# Patient Record
Sex: Male | Born: 1975 | Race: White | Hispanic: No | Marital: Married | Smoking: Heavy tobacco smoker
Health system: Southern US, Community
[De-identification: ages and names within clinical notes are randomized; demographics above are authoritative.]

## PROBLEM LIST (undated history)

## (undated) DIAGNOSIS — I10 Essential (primary) hypertension: Secondary | ICD-10-CM

## (undated) DIAGNOSIS — E119 Type 2 diabetes mellitus without complications: Secondary | ICD-10-CM

## (undated) DIAGNOSIS — M623 Immobility syndrome (paraplegic): Secondary | ICD-10-CM

## (undated) DIAGNOSIS — Z87440 Personal history of urinary (tract) infections: Secondary | ICD-10-CM

## (undated) HISTORY — PX: COCCYGECTOMY: SUR274

## (undated) HISTORY — PX: COLON SURGERY: SHX602

---

## 2015-01-05 ENCOUNTER — Inpatient Hospital Stay (HOSPITAL_COMMUNITY)
Admit: 2015-01-05 | Discharge: 2015-02-10 | Disposition: A | Discharge status: Home Health | Payer: Medicare PPO | Source: Other Acute Inpatient Hospital | Attending: Internal Medicine | Admitting: Internal Medicine

## 2015-01-05 DIAGNOSIS — L98429 Non-pressure chronic ulcer of back with unspecified severity: Secondary | ICD-10-CM | POA: Diagnosis present

## 2015-01-05 DIAGNOSIS — Z95828 Presence of other vascular implants and grafts: Secondary | ICD-10-CM

## 2015-01-05 DIAGNOSIS — R52 Pain, unspecified: Secondary | ICD-10-CM

## 2015-01-05 DIAGNOSIS — L0291 Cutaneous abscess, unspecified: Secondary | ICD-10-CM

## 2015-01-05 HISTORY — DX: Type 2 diabetes mellitus without complications: E11.9

## 2015-01-05 HISTORY — DX: Essential (primary) hypertension: I10

## 2015-01-05 HISTORY — DX: Personal history of urinary (tract) infections: Z87.440

## 2015-01-05 HISTORY — DX: Immobility syndrome (paraplegic): M62.3

## 2015-01-06 LAB — CBC WITH DIFFERENTIAL/PLATELET
Basophils Absolute: 0 10*3/uL (ref 0.0–0.1)
Basophils Relative: 0 %
EOS ABS: 0.1 10*3/uL (ref 0.0–0.7)
Eosinophils Relative: 2 %
HEMATOCRIT: 22.5 % — AB (ref 39.0–52.0)
HEMOGLOBIN: 7.2 g/dL — AB (ref 13.0–17.0)
LYMPHS ABS: 1.1 10*3/uL (ref 0.7–4.0)
Lymphocytes Relative: 17 %
MCH: 27.4 pg (ref 26.0–34.0)
MCHC: 32 g/dL (ref 30.0–36.0)
MCV: 85.6 fL (ref 78.0–100.0)
MONO ABS: 0.6 10*3/uL (ref 0.1–1.0)
MONOS PCT: 9 %
NEUTROS ABS: 4.6 10*3/uL (ref 1.7–7.7)
NEUTROS PCT: 72 %
Platelets: 253 10*3/uL (ref 150–400)
RBC: 2.63 MIL/uL — ABNORMAL LOW (ref 4.22–5.81)
RDW: 17.3 % — AB (ref 11.5–15.5)
WBC: 6.5 10*3/uL (ref 4.0–10.5)

## 2015-01-06 LAB — COMPREHENSIVE METABOLIC PANEL
ALK PHOS: 113 U/L (ref 38–126)
ALT: 11 U/L — ABNORMAL LOW (ref 17–63)
ANION GAP: 7 (ref 5–15)
AST: 23 U/L (ref 15–41)
Albumin: 1.9 g/dL — ABNORMAL LOW (ref 3.5–5.0)
BILIRUBIN TOTAL: 0.2 mg/dL — AB (ref 0.3–1.2)
BUN: 5 mg/dL — ABNORMAL LOW (ref 6–20)
CALCIUM: 8.2 mg/dL — AB (ref 8.9–10.3)
CO2: 23 mmol/L (ref 22–32)
Chloride: 103 mmol/L (ref 101–111)
Creatinine, Ser: 1.04 mg/dL (ref 0.61–1.24)
GFR calc non Af Amer: >60 mL/min (ref >60)
Glucose, Bld: 139 mg/dL — ABNORMAL HIGH (ref 65–99)
Potassium: 4.5 mmol/L (ref 3.5–5.1)
Sodium: 133 mmol/L — ABNORMAL LOW (ref 135–145)
TOTAL PROTEIN: 6.4 g/dL — AB (ref 6.5–8.1)

## 2015-01-06 LAB — VITAMIN B12: Vitamin B-12: 546 pg/mL (ref 180–914)

## 2015-01-06 LAB — FERRITIN: Ferritin: 252 ng/mL (ref 24–336)

## 2015-01-06 LAB — IRON AND TIBC
Iron: 14 ug/dL — ABNORMAL LOW (ref 45–182)
SATURATION RATIOS: 10 % — AB (ref 17.9–39.5)
TIBC: 137 ug/dL — ABNORMAL LOW (ref 250–450)
UIBC: 123 ug/dL

## 2015-01-06 LAB — PHOSPHORUS: PHOSPHORUS: 4.2 mg/dL (ref 2.5–4.6)

## 2015-01-06 LAB — MAGNESIUM: MAGNESIUM: 1.9 mg/dL (ref 1.7–2.4)

## 2015-01-07 LAB — HEMOGLOBIN AND HEMATOCRIT, BLOOD
HCT: 24.5 % — ABNORMAL LOW (ref 39.0–52.0)
Hemoglobin: 7.9 g/dL — ABNORMAL LOW (ref 13.0–17.0)

## 2015-01-08 LAB — CBC WITH DIFFERENTIAL/PLATELET
BASOS ABS: 0 10*3/uL (ref 0.0–0.1)
Basophils Relative: 0 %
EOS PCT: 2 %
Eosinophils Absolute: 0.1 10*3/uL (ref 0.0–0.7)
HEMATOCRIT: 24 % — AB (ref 39.0–52.0)
HEMOGLOBIN: 7.7 g/dL — AB (ref 13.0–17.0)
LYMPHS ABS: 1.3 10*3/uL (ref 0.7–4.0)
LYMPHS PCT: 19 %
MCH: 27.5 pg (ref 26.0–34.0)
MCHC: 32.1 g/dL (ref 30.0–36.0)
MCV: 85.7 fL (ref 78.0–100.0)
Monocytes Absolute: 0.7 10*3/uL (ref 0.1–1.0)
Monocytes Relative: 10 %
NEUTROS ABS: 4.9 10*3/uL (ref 1.7–7.7)
NEUTROS PCT: 69 %
PLATELETS: 253 10*3/uL (ref 150–400)
RBC: 2.8 MIL/uL — AB (ref 4.22–5.81)
RDW: 17.3 % — ABNORMAL HIGH (ref 11.5–15.5)
WBC: 6.9 10*3/uL (ref 4.0–10.5)

## 2015-01-08 LAB — FOLATE RBC
FOLATE, RBC: 1093 ng/mL (ref >498)
Folate, Hemolysate: 249.3 ng/mL
Hematocrit: 22.8 % — ABNORMAL LOW (ref 37.5–51.0)

## 2015-01-08 LAB — RENAL FUNCTION PANEL
ANION GAP: 7 (ref 5–15)
Albumin: 1.8 g/dL — ABNORMAL LOW (ref 3.5–5.0)
BUN: 7 mg/dL (ref 6–20)
CHLORIDE: 102 mmol/L (ref 101–111)
CO2: 24 mmol/L (ref 22–32)
CREATININE: 0.91 mg/dL (ref 0.61–1.24)
Calcium: 8.4 mg/dL — ABNORMAL LOW (ref 8.9–10.3)
Glucose, Bld: 105 mg/dL — ABNORMAL HIGH (ref 65–99)
Phosphorus: 4.4 mg/dL (ref 2.5–4.6)
Potassium: 4.5 mmol/L (ref 3.5–5.1)
Sodium: 133 mmol/L — ABNORMAL LOW (ref 135–145)

## 2015-01-11 LAB — CBC
HEMATOCRIT: 24.2 % — AB (ref 39.0–52.0)
HEMOGLOBIN: 7.4 g/dL — AB (ref 13.0–17.0)
MCH: 26.2 pg (ref 26.0–34.0)
MCHC: 30.6 g/dL (ref 30.0–36.0)
MCV: 85.8 fL (ref 78.0–100.0)
Platelets: 276 10*3/uL (ref 150–400)
RBC: 2.82 MIL/uL — AB (ref 4.22–5.81)
RDW: 16.9 % — ABNORMAL HIGH (ref 11.5–15.5)
WBC: 6.5 10*3/uL (ref 4.0–10.5)

## 2015-01-11 LAB — BASIC METABOLIC PANEL
Anion gap: 7 (ref 5–15)
BUN: 8 mg/dL (ref 6–20)
CHLORIDE: 96 mmol/L — AB (ref 101–111)
CO2: 29 mmol/L (ref 22–32)
CREATININE: 0.96 mg/dL (ref 0.61–1.24)
Calcium: 8.6 mg/dL — ABNORMAL LOW (ref 8.9–10.3)
GFR calc Af Amer: >60 mL/min (ref >60)
GFR calc non Af Amer: >60 mL/min (ref >60)
GLUCOSE: 110 mg/dL — AB (ref 65–99)
POTASSIUM: 5.2 mmol/L — AB (ref 3.5–5.1)
SODIUM: 132 mmol/L — AB (ref 135–145)

## 2015-01-12 LAB — BASIC METABOLIC PANEL
Anion gap: 10 (ref 5–15)
BUN: 9 mg/dL (ref 6–20)
CHLORIDE: 97 mmol/L — AB (ref 101–111)
CO2: 26 mmol/L (ref 22–32)
CREATININE: 1.02 mg/dL (ref 0.61–1.24)
Calcium: 8.3 mg/dL — ABNORMAL LOW (ref 8.9–10.3)
GFR calc Af Amer: >60 mL/min (ref >60)
GFR calc non Af Amer: >60 mL/min (ref >60)
Glucose, Bld: 115 mg/dL — ABNORMAL HIGH (ref 65–99)
POTASSIUM: 4.7 mmol/L (ref 3.5–5.1)
SODIUM: 133 mmol/L — AB (ref 135–145)

## 2015-01-12 LAB — PHOSPHORUS: Phosphorus: 5.8 mg/dL — ABNORMAL HIGH (ref 2.5–4.6)

## 2015-01-12 LAB — MAGNESIUM: MAGNESIUM: 2 mg/dL (ref 1.7–2.4)

## 2015-01-12 LAB — CBC WITH DIFFERENTIAL/PLATELET
Basophils Absolute: 0 10*3/uL (ref 0.0–0.1)
Basophils Relative: 0 %
EOS ABS: 0.1 10*3/uL (ref 0.0–0.7)
Eosinophils Relative: 2 %
HEMATOCRIT: 23.5 % — AB (ref 39.0–52.0)
HEMOGLOBIN: 7.6 g/dL — AB (ref 13.0–17.0)
LYMPHS ABS: 1 10*3/uL (ref 0.7–4.0)
LYMPHS PCT: 17 %
MCH: 27.7 pg (ref 26.0–34.0)
MCHC: 32.3 g/dL (ref 30.0–36.0)
MCV: 85.8 fL (ref 78.0–100.0)
MONOS PCT: 11 %
Monocytes Absolute: 0.6 10*3/uL (ref 0.1–1.0)
NEUTROS ABS: 3.9 10*3/uL (ref 1.7–7.7)
NEUTROS PCT: 70 %
Platelets: 244 10*3/uL (ref 150–400)
RBC: 2.74 MIL/uL — AB (ref 4.22–5.81)
RDW: 17 % — ABNORMAL HIGH (ref 11.5–15.5)
WBC: 5.6 10*3/uL (ref 4.0–10.5)

## 2015-01-15 ENCOUNTER — Encounter: Payer: Self-pay | Admitting: Plastic Surgery

## 2015-01-15 DIAGNOSIS — L98429 Non-pressure chronic ulcer of back with unspecified severity: Secondary | ICD-10-CM | POA: Diagnosis present

## 2015-01-15 LAB — BASIC METABOLIC PANEL
ANION GAP: 9 (ref 5–15)
BUN: 7 mg/dL (ref 6–20)
CHLORIDE: 97 mmol/L — AB (ref 101–111)
CO2: 28 mmol/L (ref 22–32)
Calcium: 8.4 mg/dL — ABNORMAL LOW (ref 8.9–10.3)
Creatinine, Ser: 0.95 mg/dL (ref 0.61–1.24)
GFR calc non Af Amer: >60 mL/min (ref >60)
GLUCOSE: 98 mg/dL (ref 65–99)
Potassium: 5.1 mmol/L (ref 3.5–5.1)
Sodium: 134 mmol/L — ABNORMAL LOW (ref 135–145)

## 2015-01-15 LAB — CBC
HCT: 25.3 % — ABNORMAL LOW (ref 39.0–52.0)
HEMOGLOBIN: 7.6 g/dL — AB (ref 13.0–17.0)
MCH: 26.2 pg (ref 26.0–34.0)
MCHC: 30 g/dL (ref 30.0–36.0)
MCV: 87.2 fL (ref 78.0–100.0)
Platelets: 326 10*3/uL (ref 150–400)
RBC: 2.9 MIL/uL — AB (ref 4.22–5.81)
RDW: 17.4 % — ABNORMAL HIGH (ref 11.5–15.5)
WBC: 7.4 10*3/uL (ref 4.0–10.5)

## 2015-01-15 NOTE — Consult Note (Signed)
Reason for Consult: sacral ulcer Referring Physician: Dr. Weyman Freeman ALI  Brandon Freeman is an 39 y.o. male.  HPI: The patient is a 39 yrs old wm here for rehab after being discharged from [place].  He underwent a coccygectomy at [facility] with debridement.  He has leakage around his foley.  There is a large sacral wound with bone exposed.  There is also a wound on his scrotal area with yellow drainage concerning for abscess.  There is minimal granulation tissue around the area with small amounts of fibrous tissue.  Currently, he is being treated with a VAC.  Past Medical History  Diagnosis Date  . Paraplegic immobility syndrome   . Diabetes mellitus without complication ([place])   . History of frequent urinary tract infections   . Hypertension     Past Surgical History  Procedure Laterality Date  . Colon surgery    . Coccygectomy      Family History  Problem Relation Age of Onset  . Kidney failure Father     Social History:  reports that he has been smoking Cigarettes.  He has been smoking about 1.50 packs per day. He does not have any smokeless tobacco history on file. His alcohol and drug histories are not on file.  Allergies: Not on File  Medications: I have reviewed the patient's current medications.  Results for orders placed or performed during the hospital encounter of 01/05/15 (from the past 48 hour(s))  Basic metabolic panel     Status: Abnormal   Collection Time: 01/15/15  5:56 AM  Result Value Ref Range   Sodium 134 (L) 135 - 145 mmol/L   Potassium 5.1 3.5 - 5.1 mmol/L    Comment: HEMOLYSIS AT THIS LEVEL MAY AFFECT RESULT   Chloride 97 (L) 101 - 111 mmol/L   CO2 28 22 - 32 mmol/L   Glucose, Bld 98 65 - 99 mg/dL   BUN 7 6 - 20 mg/dL   Creatinine, Ser 0.95 0.61 - 1.24 mg/dL   Calcium 8.4 (L) 8.9 - 10.3 mg/dL   GFR calc non Af Amer >60 >60 mL/min   GFR calc Af Amer >60 >60 mL/min    Comment: (NOTE) The eGFR has been calculated using the CKD EPI equation. This calculation  has not been validated in all clinical situations. eGFR's persistently <60 mL/min signify possible Chronic Kidney Disease.    Anion gap 9 5 - 15  CBC     Status: Abnormal   Collection Time: 01/15/15  8:45 AM  Result Value Ref Range   WBC 7.4 4.0 - 10.5 K/uL   RBC 2.90 (L) 4.22 - 5.81 MIL/uL   Hemoglobin 7.6 (L) 13.0 - 17.0 g/dL   HCT 25.3 (L) 39.0 - 52.0 %   MCV 87.2 78.0 - 100.0 fL   MCH 26.2 26.0 - 34.0 pg   MCHC 30.0 30.0 - 36.0 g/dL   RDW 17.4 (H) 11.5 - 15.5 %   Platelets 326 150 - 400 K/uL    No results found.  Review of Systems  Constitutional: Negative.   HENT: Negative.   Eyes: Negative.   Respiratory: Negative.   Cardiovascular: Negative.   Gastrointestinal: Negative.   Genitourinary: Negative.   Musculoskeletal: Negative.   Skin: Negative.   Neurological: Negative.   Psychiatric/Behavioral: Negative.    There were no vitals taken for this visit. Physical Exam  Constitutional: He is oriented to person, place, and time. He appears well-developed.  HENT:  Head: Normocephalic and atraumatic.  Eyes: Conjunctivae  and EOM are normal. Pupils are equal, round, and reactive to light.  Cardiovascular: Normal rate.   Respiratory: Effort normal.  GI:  Colostomy in place  Neurological: He is alert and oriented to person, place, and time.  Psychiatric: He has a normal mood and affect. Judgment and thought content normal.    Assessment/Plan: Recommend multivitamin daily, Vit C 500 mg daily, Zinc 60 mg twice a day. Protein drinks is very important with nutrition consult and diabetes educator.  He will need irrigation and debridement with surgery and evaluation from Urology.  Recommend Dakins solution wet to dry twice a day while awaiting surgery/urology consult.   Brandon Freeman 01/15/2015, 1:42 PM

## 2015-01-16 ENCOUNTER — Other Ambulatory Visit (HOSPITAL_COMMUNITY): Payer: Medicare PPO

## 2015-01-18 LAB — VANCOMYCIN, TROUGH: Vancomycin Tr: 16 ug/mL (ref 10.0–20.0)

## 2015-01-18 LAB — POTASSIUM: POTASSIUM: 3.9 mmol/L (ref 3.5–5.1)

## 2015-01-19 LAB — WOUND CULTURE

## 2015-01-20 LAB — CBC WITH DIFFERENTIAL/PLATELET
BASOS PCT: 0 %
Basophils Absolute: 0 10*3/uL (ref 0.0–0.1)
EOS ABS: 0.1 10*3/uL (ref 0.0–0.7)
EOS PCT: 1 %
HCT: 22.6 % — ABNORMAL LOW (ref 39.0–52.0)
Hemoglobin: 7.2 g/dL — ABNORMAL LOW (ref 13.0–17.0)
LYMPHS ABS: 1.4 10*3/uL (ref 0.7–4.0)
Lymphocytes Relative: 16 %
MCH: 27.6 pg (ref 26.0–34.0)
MCHC: 31.9 g/dL (ref 30.0–36.0)
MCV: 86.6 fL (ref 78.0–100.0)
MONO ABS: 0.5 10*3/uL (ref 0.1–1.0)
MONOS PCT: 6 %
Neutro Abs: 6.5 10*3/uL (ref 1.7–7.7)
Neutrophils Relative %: 77 %
PLATELETS: 280 10*3/uL (ref 150–400)
RBC: 2.61 MIL/uL — ABNORMAL LOW (ref 4.22–5.81)
RDW: 17 % — AB (ref 11.5–15.5)
WBC: 8.5 10*3/uL (ref 4.0–10.5)

## 2015-01-20 LAB — SEDIMENTATION RATE: Sed Rate: 129 mm/hr — ABNORMAL HIGH (ref 0–16)

## 2015-01-20 LAB — BASIC METABOLIC PANEL
Anion gap: 5 (ref 5–15)
BUN: 7 mg/dL (ref 6–20)
CALCIUM: 8.1 mg/dL — AB (ref 8.9–10.3)
CHLORIDE: 96 mmol/L — AB (ref 101–111)
CO2: 31 mmol/L (ref 22–32)
CREATININE: 0.87 mg/dL (ref 0.61–1.24)
GFR calc Af Amer: >60 mL/min (ref >60)
Glucose, Bld: 119 mg/dL — ABNORMAL HIGH (ref 65–99)
Potassium: 4 mmol/L (ref 3.5–5.1)
SODIUM: 132 mmol/L — AB (ref 135–145)

## 2015-01-20 LAB — C-REACTIVE PROTEIN: CRP: 15.3 mg/dL — AB (ref <1.0)

## 2015-01-21 ENCOUNTER — Other Ambulatory Visit (HOSPITAL_COMMUNITY): Payer: Medicare PPO

## 2015-01-22 LAB — CBC WITH DIFFERENTIAL/PLATELET
Basophils Absolute: 0 10*3/uL (ref 0.0–0.1)
Basophils Relative: 0 %
Eosinophils Absolute: 0.2 10*3/uL (ref 0.0–0.7)
Eosinophils Relative: 2 %
HEMATOCRIT: 23.1 % — AB (ref 39.0–52.0)
Hemoglobin: 7.3 g/dL — ABNORMAL LOW (ref 13.0–17.0)
LYMPHS ABS: 1.3 10*3/uL (ref 0.7–4.0)
Lymphocytes Relative: 14 %
MCH: 27.5 pg (ref 26.0–34.0)
MCHC: 31.6 g/dL (ref 30.0–36.0)
MCV: 87.2 fL (ref 78.0–100.0)
MONO ABS: 0.6 10*3/uL (ref 0.1–1.0)
MONOS PCT: 7 %
Neutro Abs: 7.4 10*3/uL (ref 1.7–7.7)
Neutrophils Relative %: 77 %
Platelets: 282 10*3/uL (ref 150–400)
RBC: 2.65 MIL/uL — ABNORMAL LOW (ref 4.22–5.81)
RDW: 17.1 % — AB (ref 11.5–15.5)
WBC: 9.5 10*3/uL (ref 4.0–10.5)

## 2015-01-22 LAB — BASIC METABOLIC PANEL
Anion gap: 8 (ref 5–15)
BUN: 6 mg/dL (ref 6–20)
CALCIUM: 8.1 mg/dL — AB (ref 8.9–10.3)
CO2: 28 mmol/L (ref 22–32)
CREATININE: 0.95 mg/dL (ref 0.61–1.24)
Chloride: 97 mmol/L — ABNORMAL LOW (ref 101–111)
GFR calc Af Amer: >60 mL/min (ref >60)
GFR calc non Af Amer: >60 mL/min (ref >60)
GLUCOSE: 114 mg/dL — AB (ref 65–99)
Potassium: 3.9 mmol/L (ref 3.5–5.1)
Sodium: 133 mmol/L — ABNORMAL LOW (ref 135–145)

## 2015-01-27 LAB — BASIC METABOLIC PANEL
Anion gap: 5 (ref 5–15)
BUN: 7 mg/dL (ref 6–20)
CHLORIDE: 101 mmol/L (ref 101–111)
CO2: 30 mmol/L (ref 22–32)
CREATININE: 0.73 mg/dL (ref 0.61–1.24)
Calcium: 8.3 mg/dL — ABNORMAL LOW (ref 8.9–10.3)
GFR calc Af Amer: >60 mL/min (ref >60)
GLUCOSE: 86 mg/dL (ref 65–99)
POTASSIUM: 4.4 mmol/L (ref 3.5–5.1)
SODIUM: 136 mmol/L (ref 135–145)

## 2015-01-27 LAB — CBC WITH DIFFERENTIAL/PLATELET
BASOS ABS: 0 10*3/uL (ref 0.0–0.1)
BASOS PCT: 0 %
EOS ABS: 0.1 10*3/uL (ref 0.0–0.7)
Eosinophils Relative: 2 %
HCT: 22.5 % — ABNORMAL LOW (ref 39.0–52.0)
HEMOGLOBIN: 6.8 g/dL — AB (ref 13.0–17.0)
Lymphocytes Relative: 24 %
Lymphs Abs: 1.6 10*3/uL (ref 0.7–4.0)
MCH: 27.1 pg (ref 26.0–34.0)
MCHC: 30.2 g/dL (ref 30.0–36.0)
MCV: 89.6 fL (ref 78.0–100.0)
Monocytes Absolute: 0.4 10*3/uL (ref 0.1–1.0)
Monocytes Relative: 6 %
NEUTROS ABS: 4.7 10*3/uL (ref 1.7–7.7)
NEUTROS PCT: 69 %
PLATELETS: 343 10*3/uL (ref 150–400)
RBC: 2.51 MIL/uL — AB (ref 4.22–5.81)
RDW: 16.2 % — ABNORMAL HIGH (ref 11.5–15.5)
WBC: 6.8 10*3/uL (ref 4.0–10.5)

## 2015-01-27 LAB — PREPARE RBC (CROSSMATCH)

## 2015-01-27 LAB — SEDIMENTATION RATE

## 2015-01-27 LAB — C-REACTIVE PROTEIN: CRP: 4.6 mg/dL — ABNORMAL HIGH (ref <1.0)

## 2015-01-28 LAB — HEMOGLOBIN AND HEMATOCRIT, BLOOD
HCT: 28.8 % — ABNORMAL LOW (ref 39.0–52.0)
HEMOGLOBIN: 8.8 g/dL — AB (ref 13.0–17.0)

## 2015-01-29 LAB — TYPE AND SCREEN
ABO/RH(D): A POS
Antibody Screen: POSITIVE
DAT, IGG: POSITIVE
Donor AG Type: NEGATIVE
Donor AG Type: NEGATIVE
UNIT DIVISION: 0
Unit division: 0

## 2015-02-01 LAB — CBC WITH DIFFERENTIAL/PLATELET
BASOS ABS: 0 10*3/uL (ref 0.0–0.1)
BASOS PCT: 0 %
EOS ABS: 0.2 10*3/uL (ref 0.0–0.7)
EOS PCT: 3 %
HCT: 30.4 % — ABNORMAL LOW (ref 39.0–52.0)
Hemoglobin: 9.1 g/dL — ABNORMAL LOW (ref 13.0–17.0)
Lymphocytes Relative: 24 %
Lymphs Abs: 1.6 10*3/uL (ref 0.7–4.0)
MCH: 27.3 pg (ref 26.0–34.0)
MCHC: 29.9 g/dL — ABNORMAL LOW (ref 30.0–36.0)
MCV: 91.3 fL (ref 78.0–100.0)
Monocytes Absolute: 0.5 10*3/uL (ref 0.1–1.0)
Monocytes Relative: 8 %
NEUTROS PCT: 65 %
Neutro Abs: 4.6 10*3/uL (ref 1.7–7.7)
PLATELETS: 355 10*3/uL (ref 150–400)
RBC: 3.33 MIL/uL — AB (ref 4.22–5.81)
RDW: 15.8 % — ABNORMAL HIGH (ref 11.5–15.5)
WBC: 7 10*3/uL (ref 4.0–10.5)

## 2015-02-01 LAB — BASIC METABOLIC PANEL
ANION GAP: 7 (ref 5–15)
BUN: 6 mg/dL (ref 6–20)
CO2: 29 mmol/L (ref 22–32)
Calcium: 8.8 mg/dL — ABNORMAL LOW (ref 8.9–10.3)
Chloride: 100 mmol/L — ABNORMAL LOW (ref 101–111)
Creatinine, Ser: 0.79 mg/dL (ref 0.61–1.24)
Glucose, Bld: 103 mg/dL — ABNORMAL HIGH (ref 65–99)
POTASSIUM: 4.4 mmol/L (ref 3.5–5.1)
SODIUM: 136 mmol/L (ref 135–145)

## 2015-02-03 LAB — URINE MICROSCOPIC-ADD ON

## 2015-02-03 LAB — URINALYSIS, ROUTINE W REFLEX MICROSCOPIC
BILIRUBIN URINE: NEGATIVE
Glucose, UA: NEGATIVE mg/dL
Ketones, ur: NEGATIVE mg/dL
Leukocytes, UA: NEGATIVE
Nitrite: NEGATIVE
PH: 7 (ref 5.0–8.0)
Protein, ur: >300 mg/dL — AB
SPECIFIC GRAVITY, URINE: 1.017 (ref 1.005–1.030)

## 2015-02-05 LAB — BASIC METABOLIC PANEL
ANION GAP: 7 (ref 5–15)
BUN: 8 mg/dL (ref 6–20)
CALCIUM: 8.8 mg/dL — AB (ref 8.9–10.3)
CO2: 29 mmol/L (ref 22–32)
Chloride: 101 mmol/L (ref 101–111)
Creatinine, Ser: 0.66 mg/dL (ref 0.61–1.24)
Glucose, Bld: 117 mg/dL — ABNORMAL HIGH (ref 65–99)
Potassium: 4.4 mmol/L (ref 3.5–5.1)
Sodium: 137 mmol/L (ref 135–145)

## 2015-02-05 LAB — CBC WITH DIFFERENTIAL/PLATELET
BASOS ABS: 0 10*3/uL (ref 0.0–0.1)
BASOS PCT: 0 %
Eosinophils Absolute: 0.1 10*3/uL (ref 0.0–0.7)
Eosinophils Relative: 2 %
HEMATOCRIT: 27.7 % — AB (ref 39.0–52.0)
Hemoglobin: 8.4 g/dL — ABNORMAL LOW (ref 13.0–17.0)
Lymphocytes Relative: 29 %
Lymphs Abs: 1.6 10*3/uL (ref 0.7–4.0)
MCH: 27.7 pg (ref 26.0–34.0)
MCHC: 30.3 g/dL (ref 30.0–36.0)
MCV: 91.4 fL (ref 78.0–100.0)
MONO ABS: 0.4 10*3/uL (ref 0.1–1.0)
Monocytes Relative: 8 %
NEUTROS ABS: 3.5 10*3/uL (ref 1.7–7.7)
NEUTROS PCT: 61 %
PLATELETS: 326 10*3/uL (ref 150–400)
RBC: 3.03 MIL/uL — ABNORMAL LOW (ref 4.22–5.81)
RDW: 14.9 % (ref 11.5–15.5)
WBC: 5.6 10*3/uL (ref 4.0–10.5)

## 2015-02-05 LAB — SEDIMENTATION RATE: SED RATE: 135 mm/h — AB (ref 0–16)

## 2015-02-05 LAB — URINE CULTURE

## 2015-02-05 LAB — C-REACTIVE PROTEIN: CRP: 3.5 mg/dL — AB (ref <1.0)

## 2016-08-09 IMAGING — US US SCROTUM
1 series · 14 of 25 positions shown · non-contrast
Comparison: None.

CLINICAL DATA: Diffuse scrotal swelling, of unknown duration.
Initial encounter.

EXAM:
ULTRASOUND OF SCROTUM
TECHNIQUE: Complete ultrasound examination of the testicles, epididymis, and
other scrotal structures was performed.

[Series 1: us scrotum · 0.06mm/px · 14 of 28 slices shown]
[im 1/28]
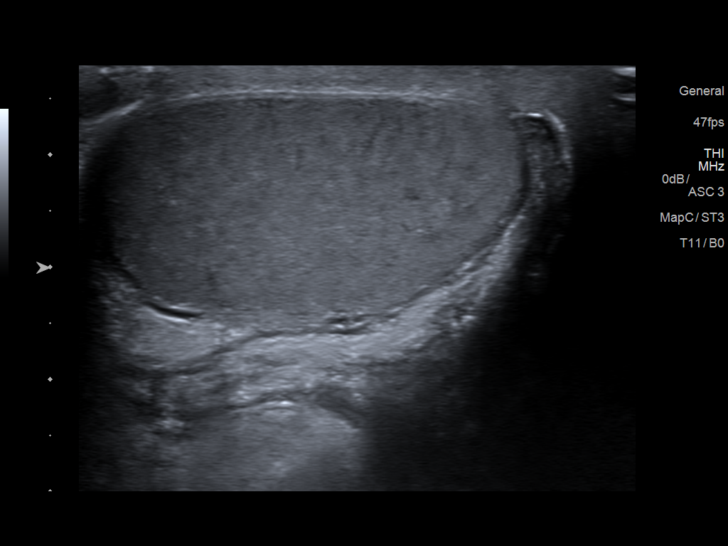
[im 3/28]
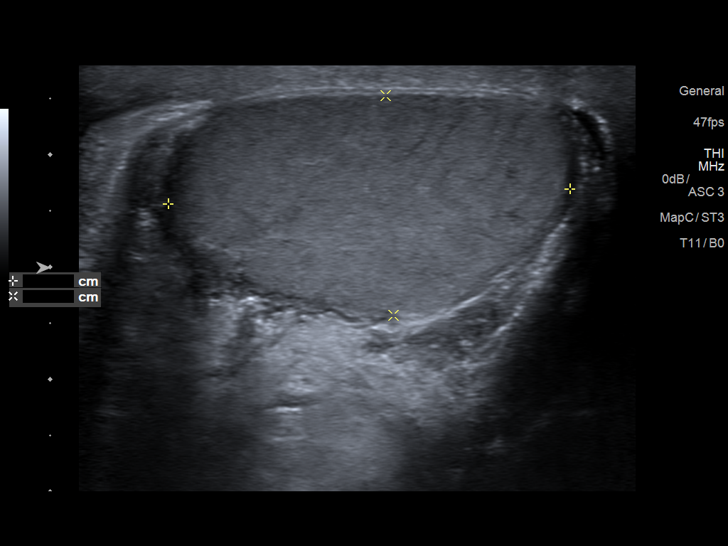
[im 5/28]
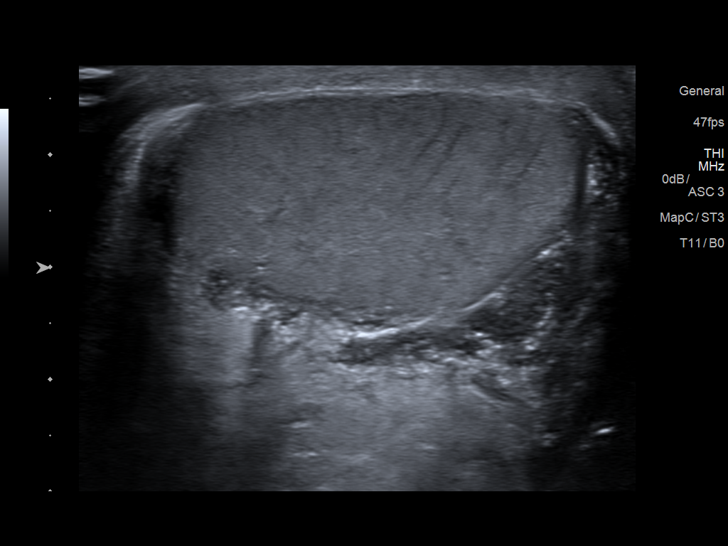
[im 7/28]
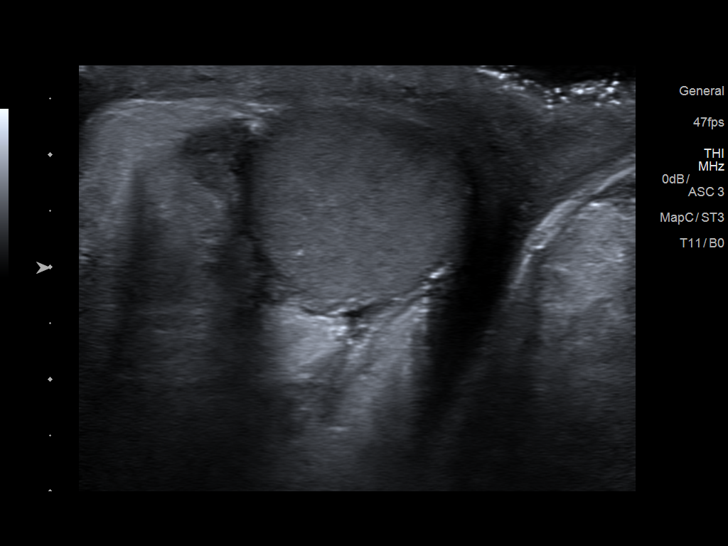
[im 10/28]
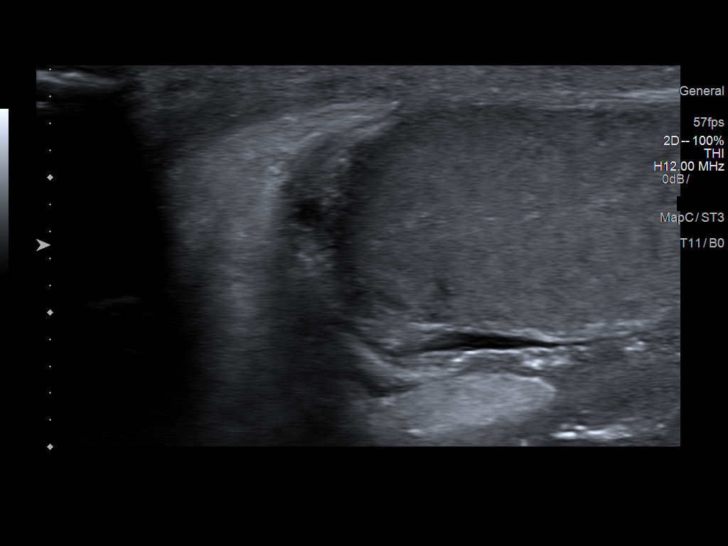
[im 11/28]
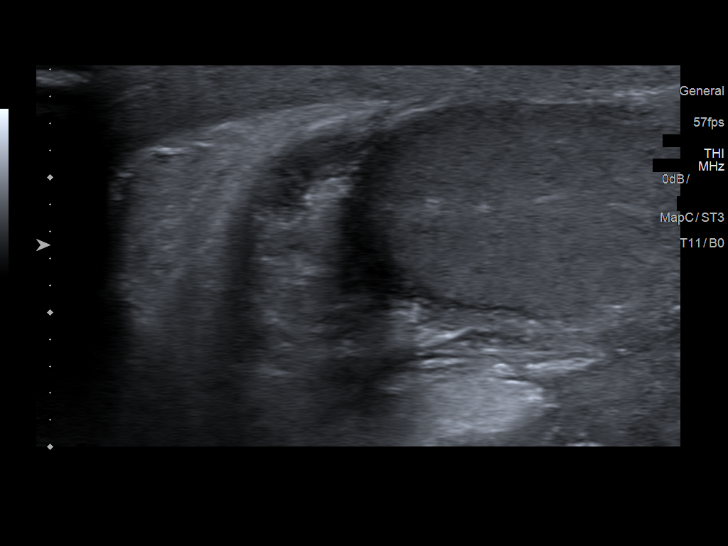
[im 13/28]
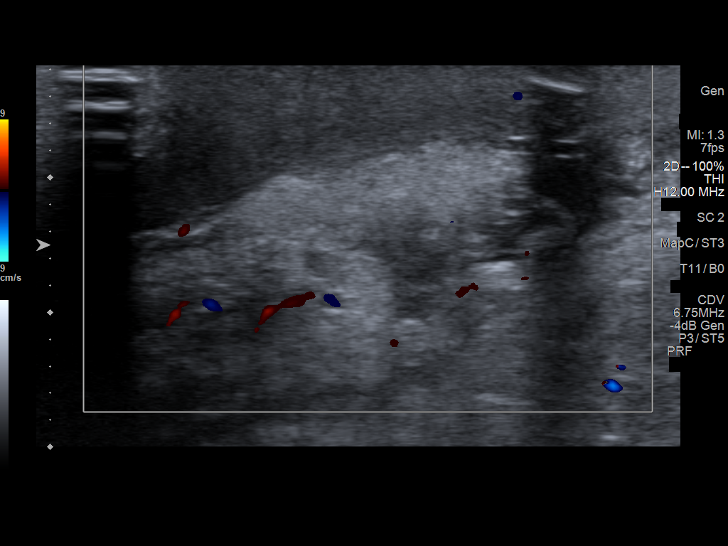
[im 15/28]
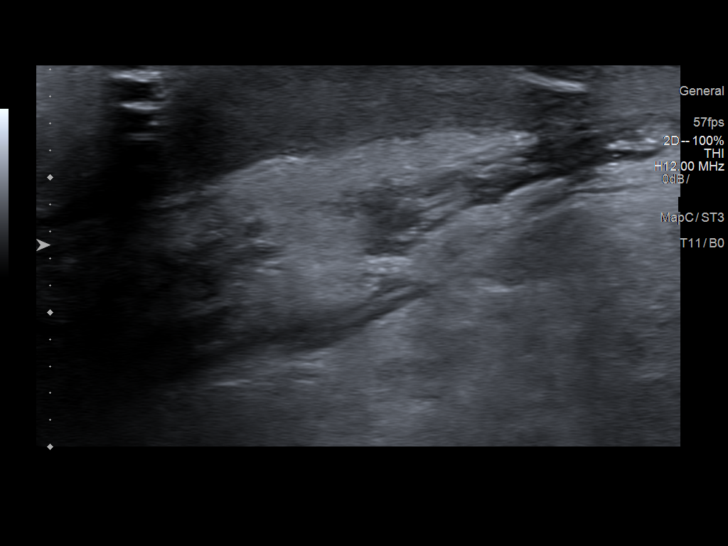
[im 17/28]
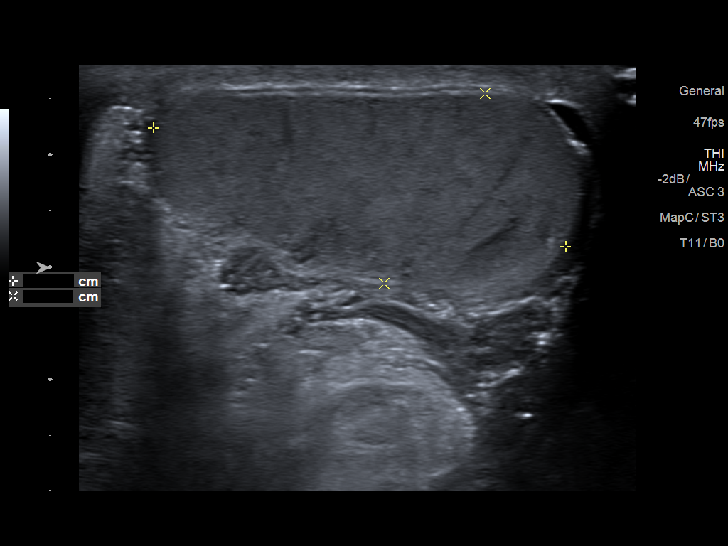
[im 19/28]
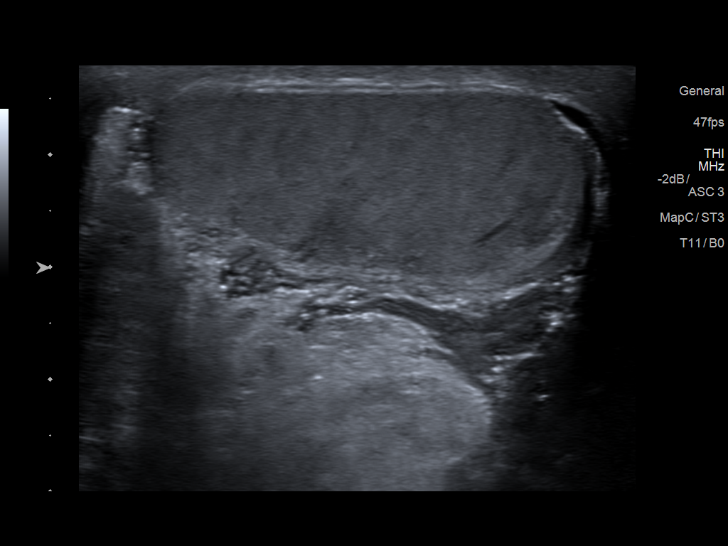
[im 21/28]
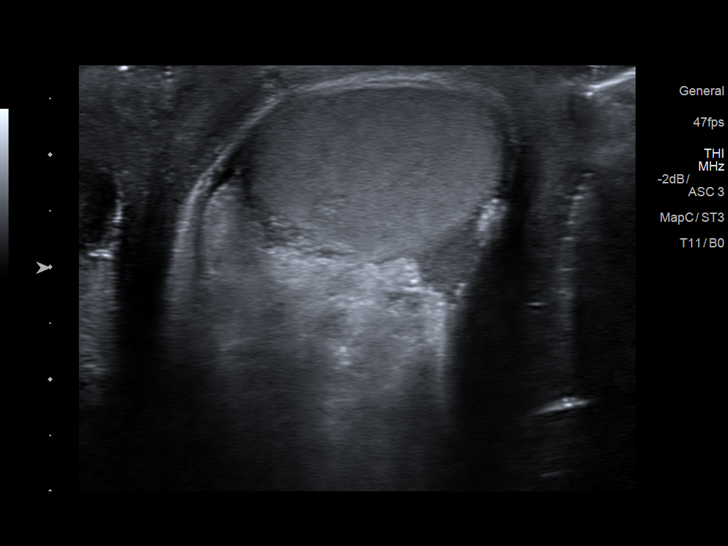
[im 23/28]
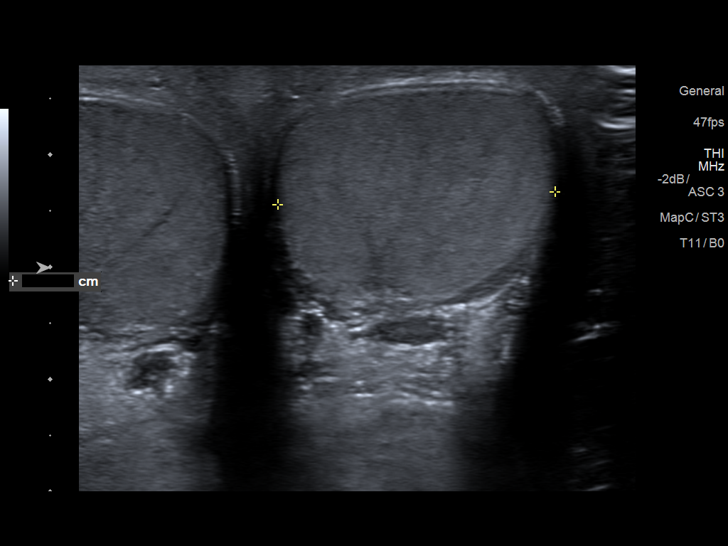
[im 25/28]
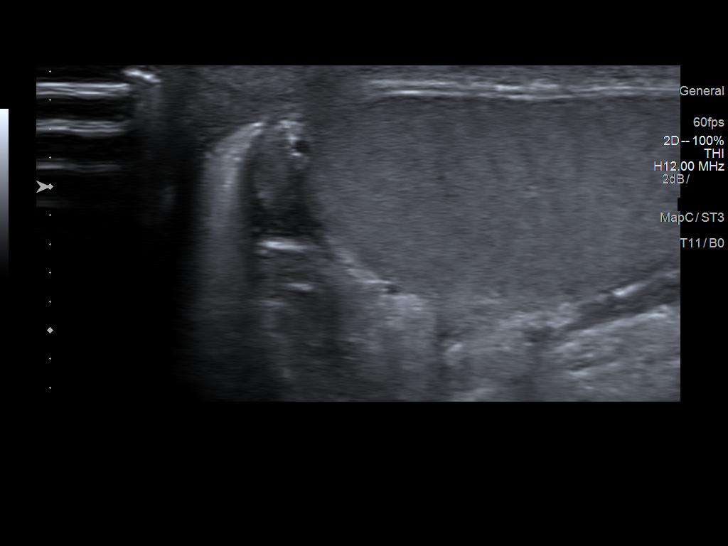
[im 28/28]
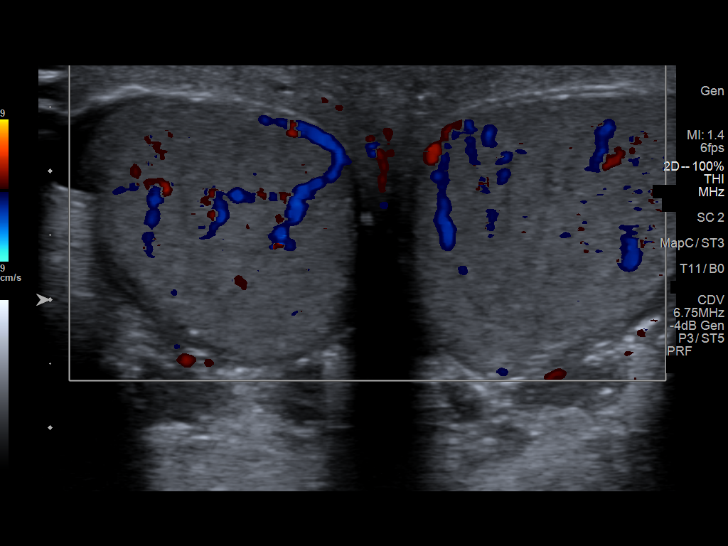

[14 of 25 positions shown; findings below may reference images not displayed]

FINDINGS: Right testicle

Measurements: 3.6 x 2.0 x 2.3 cm. No mass or microlithiasis
visualized.

Left testicle

Measurements: 3.8 x 1.9 x 2.5 cm. No mass or microlithiasis
visualized.

Right epididymis:  Normal in size and appearance.

Left epididymis:  Normal in size and appearance.

Hydrocele:  None visualized.

Varicocele:  None visualized.
IMPRESSION: Unremarkable scrotal ultrasound. No evidence for testicular torsion.

## 2019-07-26 DEATH — deceased
